# Patient Record
Sex: Female | Born: 1965 | Race: White | Hispanic: No | Marital: Married | Smoking: Never smoker
Health system: Southern US, Community
[De-identification: ages and names within clinical notes are randomized; demographics above are authoritative.]

## PROBLEM LIST (undated history)

## (undated) DIAGNOSIS — M654 Radial styloid tenosynovitis [de Quervain]: Secondary | ICD-10-CM

## (undated) DIAGNOSIS — E785 Hyperlipidemia, unspecified: Secondary | ICD-10-CM

## (undated) DIAGNOSIS — M419 Scoliosis, unspecified: Secondary | ICD-10-CM

## (undated) DIAGNOSIS — Z8489 Family history of other specified conditions: Secondary | ICD-10-CM

## (undated) DIAGNOSIS — I4729 Other ventricular tachycardia: Secondary | ICD-10-CM

## (undated) DIAGNOSIS — E039 Hypothyroidism, unspecified: Secondary | ICD-10-CM

## (undated) DIAGNOSIS — I472 Ventricular tachycardia, unspecified: Secondary | ICD-10-CM

## (undated) HISTORY — DX: Ventricular tachycardia, unspecified: I47.20

## (undated) HISTORY — PX: TRIGGER FINGER RELEASE: SHX641

## (undated) HISTORY — PX: BREAST EXCISIONAL BIOPSY: SUR124

## (undated) HISTORY — PX: COLONOSCOPY: SHX174

---

## 2004-11-10 ENCOUNTER — Ambulatory Visit: Payer: Self-pay | Admitting: Unknown Physician Specialty

## 2005-04-28 ENCOUNTER — Emergency Department: Payer: Self-pay | Admitting: Internal Medicine

## 2005-07-14 ENCOUNTER — Ambulatory Visit: Payer: Self-pay

## 2008-01-22 ENCOUNTER — Ambulatory Visit: Payer: Self-pay | Admitting: Unknown Physician Specialty

## 2009-02-18 ENCOUNTER — Ambulatory Visit: Payer: Self-pay | Admitting: Unknown Physician Specialty

## 2011-03-23 ENCOUNTER — Ambulatory Visit: Payer: Self-pay | Admitting: Unknown Physician Specialty

## 2012-04-10 ENCOUNTER — Ambulatory Visit: Payer: Self-pay | Admitting: Orthopedic Surgery

## 2017-05-17 ENCOUNTER — Other Ambulatory Visit: Payer: Self-pay | Admitting: Obstetrics & Gynecology

## 2017-05-17 DIAGNOSIS — Z1231 Encounter for screening mammogram for malignant neoplasm of breast: Secondary | ICD-10-CM

## 2017-06-01 ENCOUNTER — Ambulatory Visit
Admission: RE | Admit: 2017-06-01 | Discharge: 2017-06-01 | Disposition: A | Discharge status: Home / Self Care | Payer: 59 | Source: Ambulatory Visit | Attending: Obstetrics & Gynecology | Admitting: Obstetrics & Gynecology

## 2017-06-01 DIAGNOSIS — Z1231 Encounter for screening mammogram for malignant neoplasm of breast: Secondary | ICD-10-CM | POA: Insufficient documentation

## 2017-06-08 ENCOUNTER — Inpatient Hospital Stay
Admission: RE | Admit: 2017-06-08 | Discharge: 2017-06-08 | Disposition: A | Discharge status: Home / Self Care | Payer: Self-pay | Source: Ambulatory Visit | Attending: *Deleted | Admitting: *Deleted

## 2017-06-08 ENCOUNTER — Other Ambulatory Visit: Payer: Self-pay | Admitting: *Deleted

## 2017-06-08 DIAGNOSIS — Z9289 Personal history of other medical treatment: Secondary | ICD-10-CM

## 2018-06-13 ENCOUNTER — Other Ambulatory Visit: Payer: Self-pay | Admitting: Obstetrics & Gynecology

## 2018-06-13 DIAGNOSIS — Z1231 Encounter for screening mammogram for malignant neoplasm of breast: Secondary | ICD-10-CM

## 2018-06-27 ENCOUNTER — Ambulatory Visit
Admission: RE | Admit: 2018-06-27 | Discharge: 2018-06-27 | Disposition: A | Discharge status: Home / Self Care | Payer: Managed Care, Other (non HMO) | Source: Ambulatory Visit | Attending: Obstetrics & Gynecology | Admitting: Obstetrics & Gynecology

## 2018-06-27 DIAGNOSIS — Z1231 Encounter for screening mammogram for malignant neoplasm of breast: Secondary | ICD-10-CM | POA: Insufficient documentation

## 2019-07-25 ENCOUNTER — Other Ambulatory Visit: Payer: Self-pay | Admitting: Obstetrics & Gynecology

## 2019-07-25 DIAGNOSIS — Z1231 Encounter for screening mammogram for malignant neoplasm of breast: Secondary | ICD-10-CM

## 2019-12-02 ENCOUNTER — Ambulatory Visit
Admission: RE | Admit: 2019-12-02 | Discharge: 2019-12-02 | Disposition: A | Discharge status: Home / Self Care | Payer: Managed Care, Other (non HMO) | Source: Ambulatory Visit | Attending: Obstetrics & Gynecology | Admitting: Obstetrics & Gynecology

## 2019-12-02 DIAGNOSIS — Z1231 Encounter for screening mammogram for malignant neoplasm of breast: Secondary | ICD-10-CM | POA: Diagnosis present

## 2021-02-04 IMAGING — MG DIGITAL SCREENING BILAT W/ TOMO W/ CAD
6 of 10 series · 6 of 30 positions shown · non-contrast
Comparison: Previous exam(s).

CLINICAL DATA: Screening.

EXAM:
DIGITAL SCREENING BILATERAL MAMMOGRAM WITH TOMO AND CAD

[L MLO synth-2D]
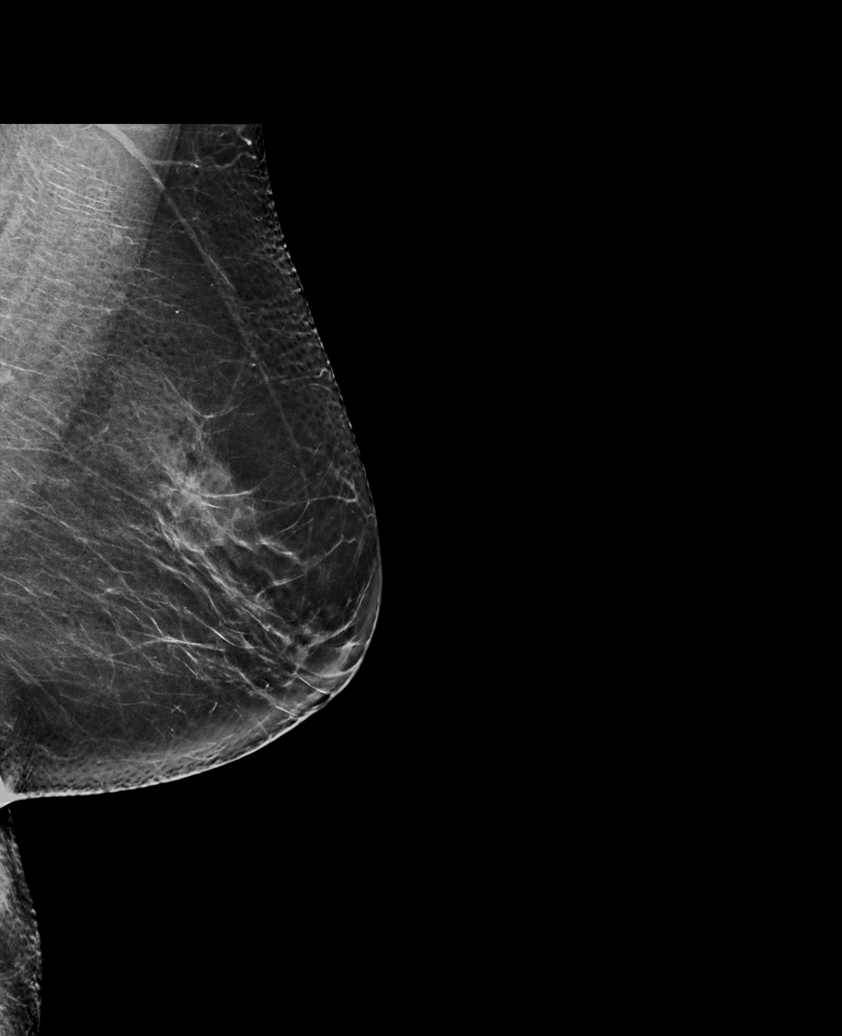

[L CC synth-2D]
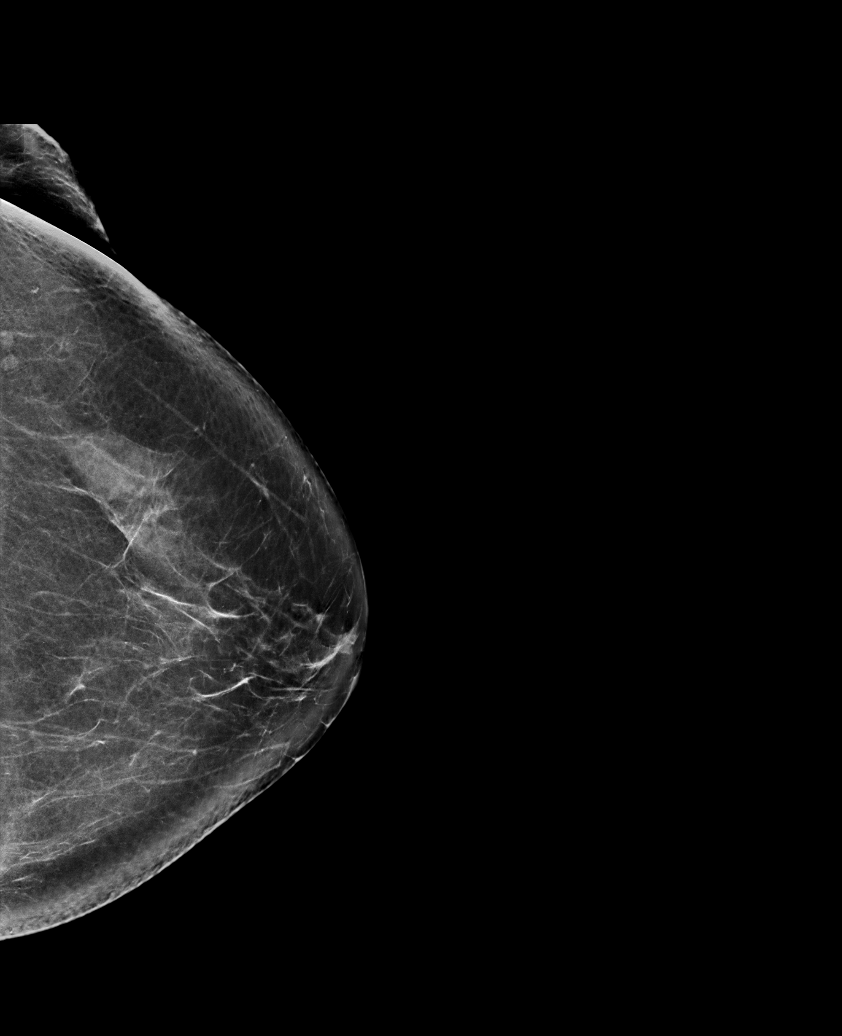

[R CC synth-2D (1 of 2)]
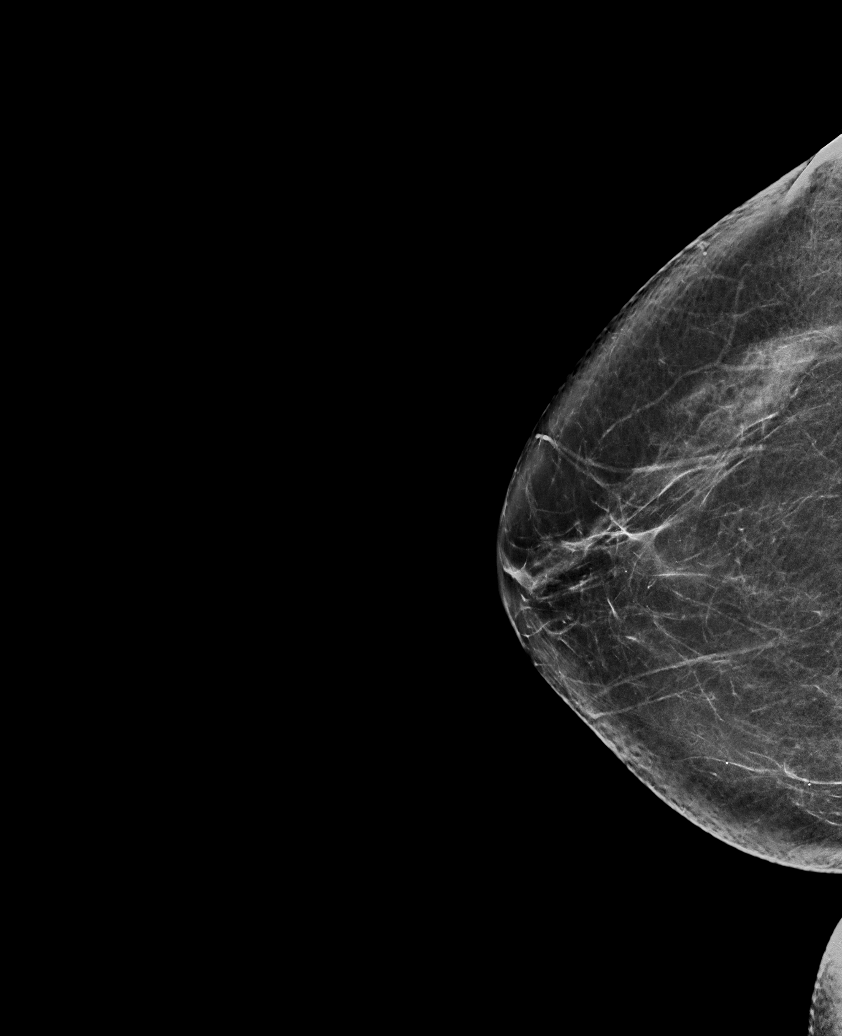

[R MLO synth-2D]
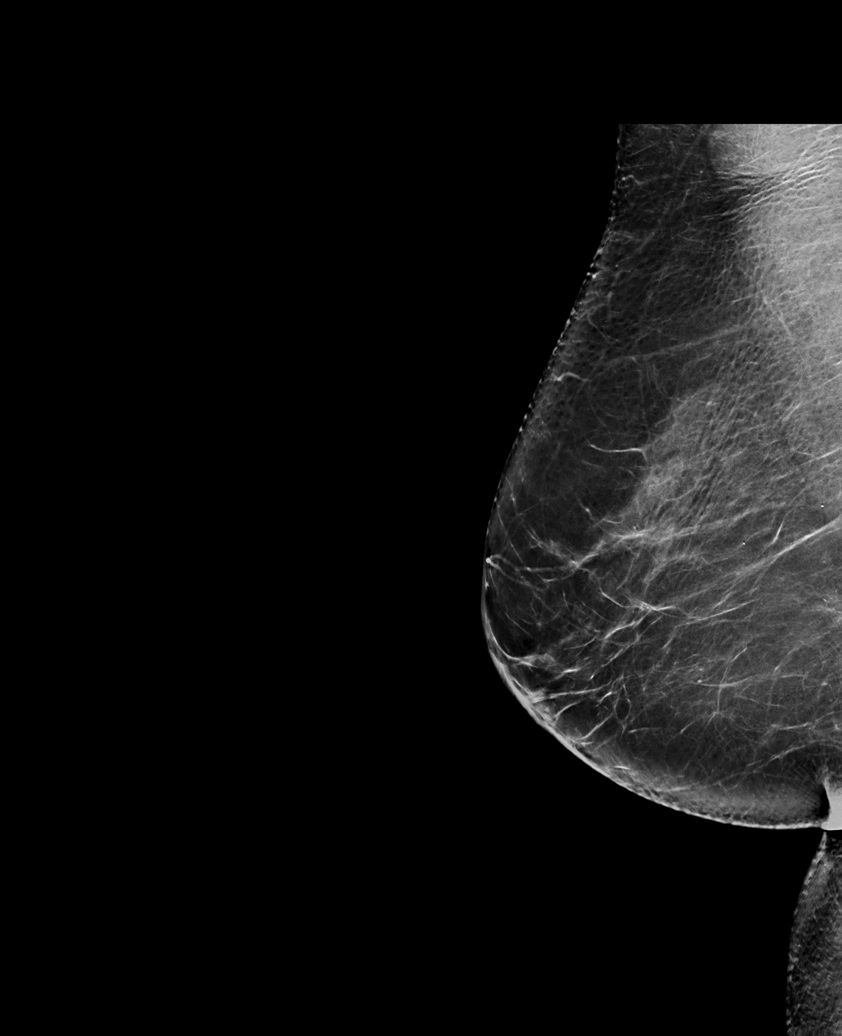

[R CC synth-2D (2 of 2)]
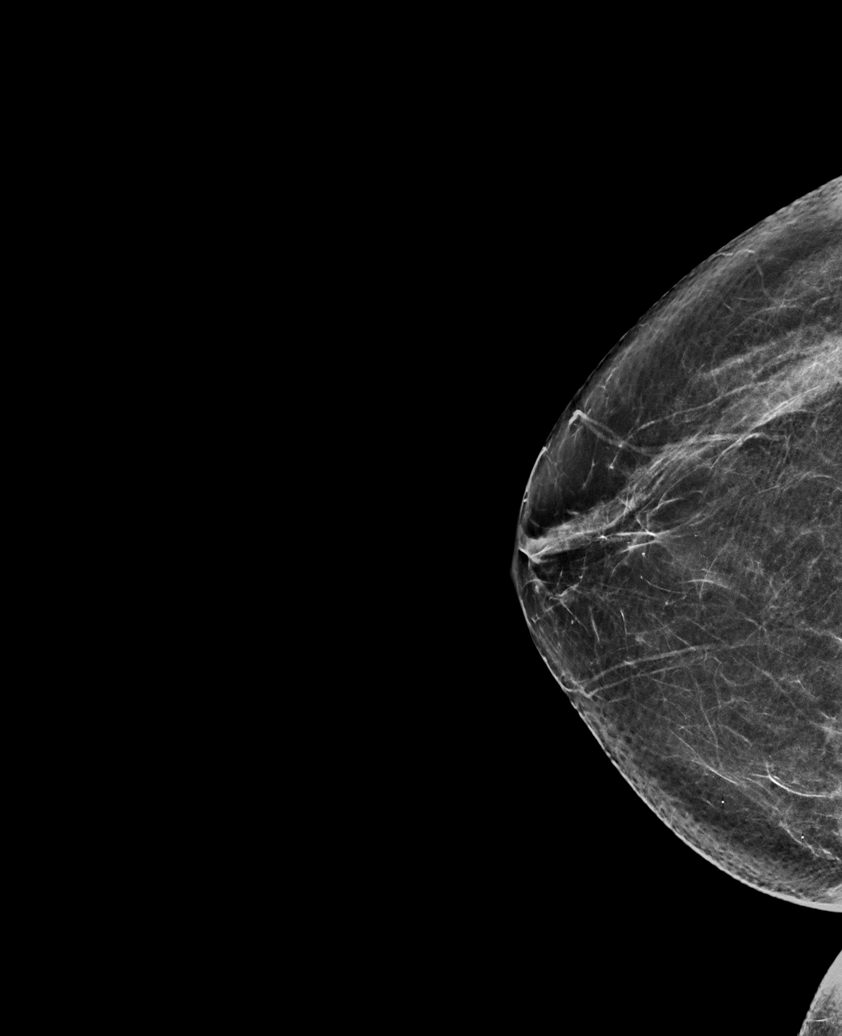

[L MLO tomo · tomo slice 39/76.0]
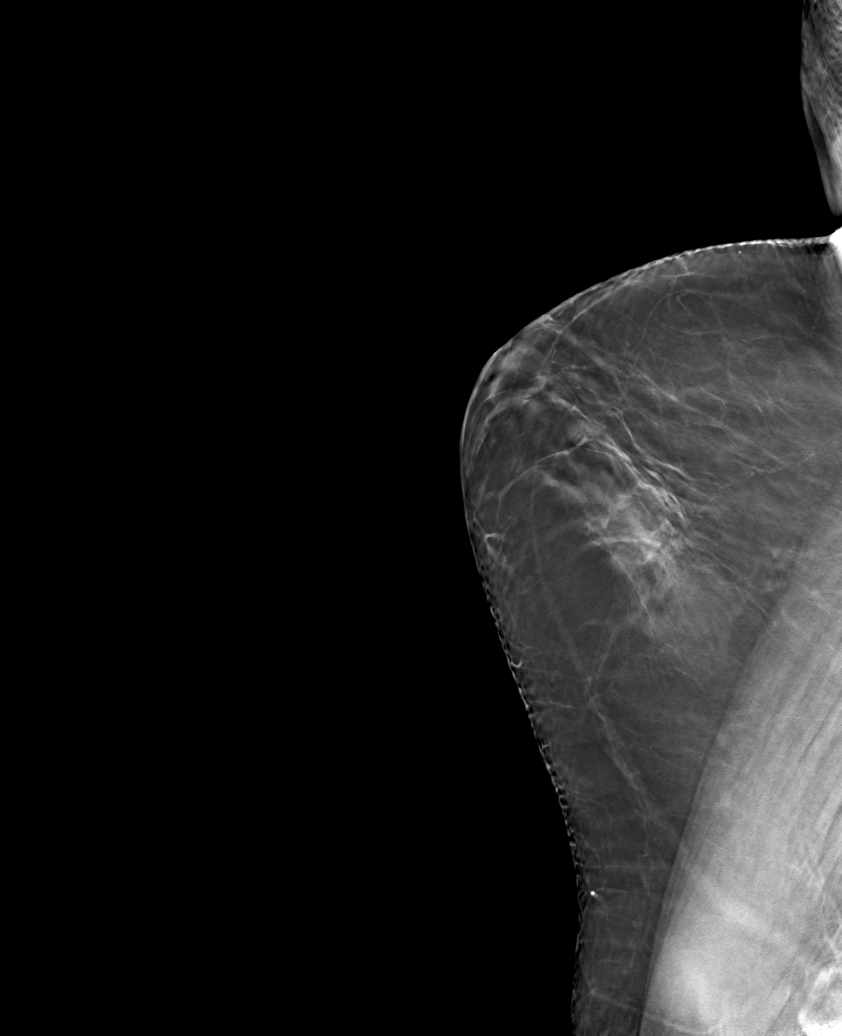

[6 of 30 positions shown; findings below may reference images not displayed]

ACR Breast Density Category b: There are scattered areas of
fibroglandular density.
FINDINGS: There are no findings suspicious for malignancy. Images were
processed with CAD.
IMPRESSION: No mammographic evidence of malignancy. A result letter of this
screening mammogram will be mailed directly to the patient.

RECOMMENDATION:
Screening mammogram in one year. (Code:[id number])

BI-RADS CATEGORY  1: Negative.

## 2022-06-01 ENCOUNTER — Other Ambulatory Visit: Payer: Self-pay | Admitting: Surgery

## 2022-06-07 ENCOUNTER — Encounter
Admission: RE | Admit: 2022-06-07 | Discharge: 2022-06-07 | Disposition: A | Discharge status: Home / Self Care | Payer: Managed Care, Other (non HMO) | Source: Ambulatory Visit | Attending: Surgery | Admitting: Surgery

## 2022-06-07 HISTORY — DX: Hypothyroidism, unspecified: E03.9

## 2022-06-07 HISTORY — DX: Scoliosis, unspecified: M41.9

## 2022-06-07 HISTORY — DX: Family history of other specified conditions: Z84.89

## 2022-06-07 NOTE — Patient Instructions (Addendum)
Your procedure is scheduled on:06-15-22 Wednesday Report to the Registration Desk on the 1st floor of the [facility].Then proceed to the 2nd floor Surgery Desk To find out your arrival time, please call [phone] between 1PM - 3PM on:06-14-22 Tuesday If your arrival time is 6:00 am, do not arrive prior to that time as the [facility] entrance doors do not open until 6:00 am.  REMEMBER: Instructions that are not followed completely may result in serious medical risk, up to and including death; or upon the discretion of your surgeon and anesthesiologist your surgery may need to be rescheduled.  Do not eat food after midnight the night before surgery.  No gum chewing, lozengers or hard candies.  You may however, drink CLEAR liquids up to 2 hours before you are scheduled to arrive for your surgery. Do not drink anything within 2 hours of your scheduled arrival time.  Clear liquids include: - water  - apple juice without pulp - gatorade (not RED colors) - black coffee or tea (Do NOT add milk or creamers to the coffee or tea) Do NOT drink anything that is not on this list.  In addition, your doctor has ordered for you to drink the provided  Ensure Pre-Surgery Clear Carbohydrate Drink  Drinking this carbohydrate drink up to two hours before surgery helps to reduce insulin resistance and improve patient outcomes. Please complete drinking 2 hours prior to scheduled arrival time.  TAKE THESE MEDICATIONS THE MORNING OF SURGERY WITH A SIP OF WATER: -flecainide (TAMBOCOR)  -levothyroxine (SYNTHROID)  -sertraline (ZOLOFT)  One week prior to surgery: Stop Anti-inflammatories (NSAIDS) such as meloxicam (MOBIC) , Advil, Aleve, Ibuprofen, Motrin, Naproxen, Naprosyn and Aspirin based products such as Excedrin, Goodys Powder, BC Powder.You may however,  take Tylenol if needed for pain up until the day of surgery.  Stop ANY OVER THE COUNTER supplements/vitamins NOW (06-07-22) until after surgery  (Calcium + D3, Multivitamin)  No Alcohol for 24 hours before or after surgery.  No Smoking including e-cigarettes for 24 hours prior to surgery.  No chewable tobacco products for at least 6 hours prior to surgery.  No nicotine patches on the day of surgery.  Do not use any "recreational" drugs for at least a week prior to your surgery.  Please be advised that the combination of cocaine and anesthesia may have negative outcomes, up to and including death. If you test positive for cocaine, your surgery will be cancelled.  On the morning of surgery brush your teeth with toothpaste and water, you may rinse your mouth with mouthwash if you wish. Do not swallow any toothpaste or mouthwash.  Use CHG Soap as directed on instruction sheet.  Do not wear jewelry, make-up, hairpins, clips or nail polish.  Do not wear lotions, powders, or perfumes.   Do not shave body from the neck down 48 hours prior to surgery just in case you cut yourself which could leave a site for infection.  Also, freshly shaved skin may become irritated if using the CHG soap.  Contact lenses, hearing aids and dentures may not be worn into surgery.  Do not bring valuables to the hospital. [facility] is not responsible for any missing/lost belongings or valuables.  Notify your doctor if there is any change in your medical condition (cold, fever, infection).  Wear comfortable clothing (specific to your surgery type) to the hospital.  After surgery, you can help prevent lung complications by doing breathing exercises.  Take deep breaths and cough every 1-2  hours. Your doctor may order a device called an Incentive Spirometer to help you take deep breaths. When coughing or sneezing, hold a pillow firmly against your incision with both hands. This is called "splinting." Doing this helps protect your incision. It also decreases belly discomfort.  If you are being admitted to the hospital overnight, leave your suitcase in the  car. After surgery it may be brought to your room.  If you are being discharged the day of surgery, you will not be allowed to drive home. You will need a responsible adult (18 years or older) to drive you home and stay with you that night.   If you are taking public transportation, you will need to have a responsible adult (18 years or older) with you. Please confirm with your physician that it is acceptable to use public transportation.   Please call the Pre-admissions Testing Dept. at [phone] if you have any questions about these instructions.  Surgery Visitation Policy:  Patients undergoing a surgery or procedure may have two family members or support persons with them as long as the person is not COVID-19 positive or experiencing its symptoms.

## 2022-06-10 ENCOUNTER — Inpatient Hospital Stay: Admission: RE | Admit: 2022-06-10 | Payer: Managed Care, Other (non HMO) | Source: Ambulatory Visit

## 2022-06-10 ENCOUNTER — Encounter: Payer: Self-pay | Admitting: Urgent Care

## 2022-06-10 ENCOUNTER — Encounter
Admission: RE | Admit: 2022-06-10 | Discharge: 2022-06-10 | Disposition: A | Discharge status: Home / Self Care | Payer: Managed Care, Other (non HMO) | Source: Ambulatory Visit | Attending: Surgery

## 2022-06-10 DIAGNOSIS — Z01810 Encounter for preprocedural cardiovascular examination: Secondary | ICD-10-CM | POA: Diagnosis not present

## 2022-06-10 DIAGNOSIS — I472 Ventricular tachycardia, unspecified: Secondary | ICD-10-CM

## 2022-06-10 DIAGNOSIS — Z01818 Encounter for other preprocedural examination: Secondary | ICD-10-CM

## 2022-06-13 ENCOUNTER — Encounter: Payer: Self-pay | Admitting: Surgery

## 2022-06-15 ENCOUNTER — Encounter: Payer: Self-pay | Admitting: Surgery

## 2022-06-15 ENCOUNTER — Encounter
Admission: RE | Disposition: A | Discharge status: Home / Self Care | Payer: Self-pay | Source: Home / Self Care | Attending: Surgery

## 2022-06-15 ENCOUNTER — Ambulatory Visit
Admission: RE | Admit: 2022-06-15 | Discharge: 2022-06-15 | Disposition: A | Discharge status: Home / Self Care | Payer: Managed Care, Other (non HMO) | Attending: Surgery | Admitting: Surgery

## 2022-06-15 ENCOUNTER — Ambulatory Visit: Payer: Managed Care, Other (non HMO) | Admitting: Urgent Care

## 2022-06-15 ENCOUNTER — Other Ambulatory Visit: Payer: Self-pay

## 2022-06-15 DIAGNOSIS — E785 Hyperlipidemia, unspecified: Secondary | ICD-10-CM | POA: Diagnosis not present

## 2022-06-15 DIAGNOSIS — Z79890 Hormone replacement therapy (postmenopausal): Secondary | ICD-10-CM | POA: Diagnosis not present

## 2022-06-15 DIAGNOSIS — Z01818 Encounter for other preprocedural examination: Secondary | ICD-10-CM

## 2022-06-15 DIAGNOSIS — Z79899 Other long term (current) drug therapy: Secondary | ICD-10-CM | POA: Insufficient documentation

## 2022-06-15 DIAGNOSIS — G5601 Carpal tunnel syndrome, right upper limb: Secondary | ICD-10-CM | POA: Insufficient documentation

## 2022-06-15 DIAGNOSIS — E039 Hypothyroidism, unspecified: Secondary | ICD-10-CM | POA: Diagnosis not present

## 2022-06-15 DIAGNOSIS — I472 Ventricular tachycardia, unspecified: Secondary | ICD-10-CM

## 2022-06-15 HISTORY — DX: Hyperlipidemia, unspecified: E78.5

## 2022-06-15 HISTORY — DX: Radial styloid tenosynovitis (de quervain): M65.4

## 2022-06-15 HISTORY — DX: Other ventricular tachycardia: I47.29

## 2022-06-15 HISTORY — PX: CARPAL TUNNEL RELEASE: SHX101

## 2022-06-15 SURGERY — RELEASE, CARPAL TUNNEL, ENDOSCOPIC
Anesthesia: General | Site: Wrist | Laterality: Right

## 2022-06-15 MED ORDER — SODIUM CHLORIDE 0.9 % IV SOLN: INTRAVENOUS | Status: DC

## 2022-06-15 MED ORDER — KETOROLAC TROMETHAMINE 30 MG/ML IJ SOLN
INTRAMUSCULAR | Status: AC
  Filled 2022-06-15: qty 1

## 2022-06-15 MED ORDER — CHLORHEXIDINE GLUCONATE 0.12 % MT SOLN
OROMUCOSAL | Status: AC
  Administered 2022-06-15: 15 mL via OROMUCOSAL
  Filled 2022-06-15: qty 15

## 2022-06-15 MED ORDER — MIDAZOLAM HCL 2 MG/2ML IJ SOLN
INTRAMUSCULAR | Status: DC | PRN
  Administered 2022-06-15: 2 mg via INTRAVENOUS

## 2022-06-15 MED ORDER — ONDANSETRON HCL 4 MG/2ML IJ SOLN
INTRAMUSCULAR | Status: DC | PRN
  Administered 2022-06-15: 4 mg via INTRAVENOUS

## 2022-06-15 MED ORDER — FAMOTIDINE 20 MG PO TABS: 20.0000 mg | ORAL_TABLET | Freq: Once | ORAL | Status: AC

## 2022-06-15 MED ORDER — LIDOCAINE HCL (PF) 2 % IJ SOLN
INTRAMUSCULAR | Status: AC
  Filled 2022-06-15: qty 5

## 2022-06-15 MED ORDER — LIDOCAINE HCL (CARDIAC) PF 100 MG/5ML IV SOSY
PREFILLED_SYRINGE | INTRAVENOUS | Status: DC | PRN
  Administered 2022-06-15: 100 mg via INTRAVENOUS

## 2022-06-15 MED ORDER — ONDANSETRON HCL 4 MG/2ML IJ SOLN
INTRAMUSCULAR | Status: AC
  Filled 2022-06-15: qty 2

## 2022-06-15 MED ORDER — FENTANYL CITRATE (PF) 100 MCG/2ML IJ SOLN
INTRAMUSCULAR | Status: DC | PRN
  Administered 2022-06-15 (×2): 50 ug via INTRAVENOUS

## 2022-06-15 MED ORDER — TRAMADOL HCL 50 MG PO TABS: 50.0000 mg | ORAL_TABLET | Freq: Four times a day (QID) | ORAL | 0 refills | Status: AC | PRN

## 2022-06-15 MED ORDER — BUPIVACAINE HCL (PF) 0.5 % IJ SOLN
INTRAMUSCULAR | Status: AC
  Filled 2022-06-15: qty 30

## 2022-06-15 MED ORDER — CEFAZOLIN SODIUM-DEXTROSE 2-4 GM/100ML-% IV SOLN
INTRAVENOUS | Status: AC
  Filled 2022-06-15: qty 100

## 2022-06-15 MED ORDER — 0.9 % SODIUM CHLORIDE (POUR BTL) OPTIME
TOPICAL | Status: DC | PRN
  Administered 2022-06-15: 500 mL

## 2022-06-15 MED ORDER — ORAL CARE MOUTH RINSE: 15.0000 mL | Freq: Once | OROMUCOSAL | Status: AC

## 2022-06-15 MED ORDER — FENTANYL CITRATE (PF) 100 MCG/2ML IJ SOLN
INTRAMUSCULAR | Status: AC
  Filled 2022-06-15: qty 2

## 2022-06-15 MED ORDER — METOCLOPRAMIDE HCL 10 MG PO TABS: 5.0000 mg | ORAL_TABLET | Freq: Three times a day (TID) | ORAL | Status: DC | PRN

## 2022-06-15 MED ORDER — TRAMADOL HCL 50 MG PO TABS: 50.0000 mg | ORAL_TABLET | Freq: Four times a day (QID) | ORAL | Status: DC | PRN

## 2022-06-15 MED ORDER — KETOROLAC TROMETHAMINE 30 MG/ML IJ SOLN
INTRAMUSCULAR | Status: DC | PRN
  Administered 2022-06-15: 15 mg via INTRAVENOUS

## 2022-06-15 MED ORDER — FAMOTIDINE 20 MG PO TABS
ORAL_TABLET | ORAL | Status: AC
  Administered 2022-06-15: 20 mg via ORAL
  Filled 2022-06-15: qty 1

## 2022-06-15 MED ORDER — DEXAMETHASONE SODIUM PHOSPHATE 10 MG/ML IJ SOLN
INTRAMUSCULAR | Status: AC
  Filled 2022-06-15: qty 1

## 2022-06-15 MED ORDER — DEXAMETHASONE SODIUM PHOSPHATE 10 MG/ML IJ SOLN
INTRAMUSCULAR | Status: DC | PRN
  Administered 2022-06-15: 10 mg via INTRAVENOUS

## 2022-06-15 MED ORDER — PROPOFOL 10 MG/ML IV BOLUS
INTRAVENOUS | Status: DC | PRN
  Administered 2022-06-15: 120 mg via INTRAVENOUS

## 2022-06-15 MED ORDER — LACTATED RINGERS IV SOLN: INTRAVENOUS | Status: DC

## 2022-06-15 MED ORDER — FENTANYL CITRATE (PF) 100 MCG/2ML IJ SOLN: 25.0000 ug | INTRAMUSCULAR | Status: DC | PRN

## 2022-06-15 MED ORDER — CEFAZOLIN SODIUM-DEXTROSE 2-4 GM/100ML-% IV SOLN
2.0000 g | INTRAVENOUS | Status: AC
  Administered 2022-06-15: 2 g via INTRAVENOUS

## 2022-06-15 MED ORDER — METOCLOPRAMIDE HCL 5 MG/ML IJ SOLN: 5.0000 mg | Freq: Three times a day (TID) | INTRAMUSCULAR | Status: DC | PRN

## 2022-06-15 MED ORDER — PROPOFOL 10 MG/ML IV BOLUS
INTRAVENOUS | Status: AC
  Filled 2022-06-15: qty 20

## 2022-06-15 MED ORDER — BUPIVACAINE HCL (PF) 0.5 % IJ SOLN
INTRAMUSCULAR | Status: DC | PRN
  Administered 2022-06-15: 10 mL

## 2022-06-15 MED ORDER — OXYCODONE HCL 5 MG PO TABS: 5.0000 mg | ORAL_TABLET | Freq: Once | ORAL | Status: DC | PRN

## 2022-06-15 MED ORDER — MIDAZOLAM HCL 2 MG/2ML IJ SOLN
INTRAMUSCULAR | Status: AC
  Filled 2022-06-15: qty 2

## 2022-06-15 MED ORDER — ONDANSETRON HCL 4 MG PO TABS: 4.0000 mg | ORAL_TABLET | Freq: Four times a day (QID) | ORAL | Status: DC | PRN

## 2022-06-15 MED ORDER — ONDANSETRON HCL 4 MG/2ML IJ SOLN: 4.0000 mg | Freq: Four times a day (QID) | INTRAMUSCULAR | Status: DC | PRN

## 2022-06-15 MED ORDER — CHLORHEXIDINE GLUCONATE 0.12 % MT SOLN: 15.0000 mL | Freq: Once | OROMUCOSAL | Status: AC

## 2022-06-15 MED ORDER — OXYCODONE HCL 5 MG/5ML PO SOLN: 5.0000 mg | Freq: Once | ORAL | Status: DC | PRN

## 2022-06-15 SURGICAL SUPPLY — 34 items
APL PRP STRL LF DISP 70% ISPRP (MISCELLANEOUS) ×1
BNDG CMPR 5X4 CHSV STRCH STRL (GAUZE/BANDAGES/DRESSINGS) ×1
BNDG COHESIVE 4X5 TAN STRL LF (GAUZE/BANDAGES/DRESSINGS) ×1 IMPLANT
BNDG ELASTIC 2X5.8 VLCR STR LF (GAUZE/BANDAGES/DRESSINGS) ×1 IMPLANT
BNDG ESMARK 4X12 TAN STRL LF (GAUZE/BANDAGES/DRESSINGS) ×1 IMPLANT
CHLORAPREP W/TINT 26 (MISCELLANEOUS) ×1 IMPLANT
CORD BIP STRL DISP 12FT (MISCELLANEOUS) ×1 IMPLANT
CUFF TOURN SGL QUICK 18X4 (TOURNIQUET CUFF) ×1 IMPLANT
DRAPE SURG 17X11 SM STRL (DRAPES) ×1 IMPLANT
FORCEPS JEWEL BIP 4-3/4 STR (INSTRUMENTS) ×1 IMPLANT
GAUZE SPONGE 4X4 12PLY STRL (GAUZE/BANDAGES/DRESSINGS) ×1 IMPLANT
GAUZE XEROFORM 1X8 LF (GAUZE/BANDAGES/DRESSINGS) ×1 IMPLANT
GLOVE BIO SURGEON STRL SZ8 (GLOVE) ×1 IMPLANT
GLOVE SURG UNDER LTX SZ8 (GLOVE) ×1 IMPLANT
GOWN STRL REUS W/ TWL LRG LVL3 (GOWN DISPOSABLE) ×1 IMPLANT
GOWN STRL REUS W/ TWL XL LVL3 (GOWN DISPOSABLE) ×1 IMPLANT
GOWN STRL REUS W/TWL LRG LVL3 (GOWN DISPOSABLE) ×1
GOWN STRL REUS W/TWL XL LVL3 (GOWN DISPOSABLE) ×1
KIT CARPAL TUNNEL (MISCELLANEOUS) ×1
KIT ESCP INSRT D SLOT CANN KN (MISCELLANEOUS) ×1 IMPLANT
KIT TURNOVER KIT A (KITS) ×1 IMPLANT
MANIFOLD NEPTUNE II (INSTRUMENTS) ×1 IMPLANT
NS IRRIG 500ML POUR BTL (IV SOLUTION) ×1 IMPLANT
PACK EXTREMITY ARMC (MISCELLANEOUS) ×1 IMPLANT
SPLINT WRIST LG LT TX990309 (SOFTGOODS) IMPLANT
SPLINT WRIST LG RT TX900304 (SOFTGOODS) IMPLANT
SPLINT WRIST M LT TX990308 (SOFTGOODS) IMPLANT
SPLINT WRIST M RT TX990303 (SOFTGOODS) IMPLANT
SPLINT WRIST XL LT TX990310 (SOFTGOODS) IMPLANT
SPLINT WRIST XL RT TX990305 (SOFTGOODS) IMPLANT
STOCKINETTE IMPERVIOUS 9X36 MD (GAUZE/BANDAGES/DRESSINGS) ×1 IMPLANT
SUT PROLENE 4 0 PS 2 18 (SUTURE) ×1 IMPLANT
TRAP FLUID SMOKE EVACUATOR (MISCELLANEOUS) ×1 IMPLANT
WATER STERILE IRR 500ML POUR (IV SOLUTION) ×1 IMPLANT

## 2022-06-15 NOTE — Anesthesia Preprocedure Evaluation (Signed)
Anesthesia Evaluation  Patient identified by MRN, date of birth, ID band Patient awake    Reviewed: Allergy & Precautions, NPO status , Patient's Chart, lab work & pertinent test results  History of Anesthesia Complications Negative for: history of anesthetic complications  Airway Mallampati: III  TM Distance: >3 FB Neck ROM: full    Dental  (+) Chipped   Pulmonary neg pulmonary ROS, neg shortness of breath,    Pulmonary exam normal        Cardiovascular Exercise Tolerance: Good + dysrhythmias Supra Ventricular Tachycardia      Neuro/Psych negative neurological ROS  negative psych ROS   GI/Hepatic negative GI ROS, Neg liver ROS, neg GERD  ,  Endo/Other  Hypothyroidism   Renal/GU      Musculoskeletal   Abdominal   Peds  Hematology negative hematology ROS (+)   Anesthesia Other Findings Past Medical History: No date: De Quervain's disease (tenosynovitis) No date: Family history of adverse reaction to anesthesia     Comment:  sister has a hard time waking up No date: HLD (hyperlipidemia) No date: Hypothyroidism No date: RVOT ventricular tachycardia (HCC)     Comment:  a.) Tx'd with daily flecanide No date: Scoliosis  Past Surgical History: No date: BREAST EXCISIONAL BIOPSY; Right     Comment:  benign No date: CESAREAN SECTION     Comment:  x2 No date: COLONOSCOPY No date: TRIGGER FINGER RELEASE; Right  BMI    Body Mass Index: 27.22 kg/m      Reproductive/Obstetrics negative OB ROS                             Anesthesia Physical Anesthesia Plan  ASA: 3  Anesthesia Plan: General LMA   Post-op Pain Management:    Induction: Intravenous  PONV Risk Score and Plan: Dexamethasone, Ondansetron, Midazolam and Treatment may vary due to age or medical condition  Airway Management Planned: LMA  Additional Equipment:   Intra-op Plan:   Post-operative Plan: Extubation in  OR  Informed Consent: I have reviewed the patients History and Physical, chart, labs and discussed the procedure including the risks, benefits and alternatives for the proposed anesthesia with the patient or authorized representative who has indicated his/her understanding and acceptance.     Dental Advisory Given  Plan Discussed with: Anesthesiologist, CRNA and Surgeon  Anesthesia Plan Comments: (Patient consented for risks of anesthesia including but not limited to:  - adverse reactions to medications - damage to eyes, teeth, lips or other oral mucosa - nerve damage due to positioning  - sore throat or hoarseness - Damage to heart, brain, nerves, lungs, other parts of body or loss of life  Patient voiced understanding.)        Anesthesia Quick Evaluation

## 2022-06-15 NOTE — Transfer of Care (Signed)
Immediate Anesthesia Transfer of Care Note  Patient: Tina Edwards  Procedure(s) Performed: CARPAL TUNNEL RELEASE ENDOSCOPIC (Right: Wrist)  Patient Location: PACU  Anesthesia Type:General  Level of Consciousness: drowsy  Airway & Oxygen Therapy: Patient Spontanous Breathing and Patient connected to face mask oxygen  Post-op Assessment: Report given to RN and Post -op Vital signs reviewed and stable  Post vital signs: Reviewed and stable  Last Vitals:  Vitals Value Taken Time  BP 124/74 06/15/22 1003  Temp 35.8 1002  Pulse 50 06/15/22 1004  Resp 15 06/15/22 1004  SpO2 100 % 06/15/22 1004  Vitals shown include unvalidated device data.  Last Pain:  Vitals:   06/15/22 0740  TempSrc: Temporal  PainSc: 0-No pain         Complications: No notable events documented.

## 2022-06-15 NOTE — Discharge Instructions (Addendum)
Orthopedic discharge instructions: Keep dressing dry and intact. Keep hand elevated above heart level. May shower after dressing removed on postop day 4 (Sunday). Cover sutures with Band-Aids after drying off, then reapply Velcro splint. Apply ice to affected area frequently. Resume meloxicam 15 mg daily OR take ibuprofen 600-800 mg TID with meals for 3-5 days, then as necessary. Take ES Tylenol or pain medication as prescribed when needed.  Return for follow-up in 10-14 days or as scheduled     .AMBULATORY SURGERY  DISCHARGE INSTRUCTIONS   The drugs that you were given will stay in your system until tomorrow so for the next 24 hours you should not:  Drive an automobile Make any legal decisions Drink any alcoholic beverage   You may resume regular meals tomorrow.  Today it is better to start with liquids and gradually work up to solid foods.  You may eat anything you prefer, but it is better to start with liquids, then soup and crackers, and gradually work up to solid foods.   Please notify your doctor immediately if you have any unusual bleeding, trouble breathing, redness and pain at the surgery site, drainage, fever, or pain not relieved by medication.     Your post-operative visit with Dr.                                       is: Date:                        Time:    Please call to schedule your post-operative visit.  Additional Instructions:

## 2022-06-15 NOTE — Anesthesia Procedure Notes (Signed)
Procedure Name: LMA Insertion Date/Time: 06/15/2022 9:32 AM  Performed by: Cammie Sickle, CRNAPre-anesthesia Checklist: Patient identified, Patient being monitored, Timeout performed, Emergency Drugs available and Suction available Patient Re-evaluated:Patient Re-evaluated prior to induction Oxygen Delivery Method: Circle system utilized Preoxygenation: Pre-oxygenation with 100% oxygen Induction Type: IV induction Ventilation: Mask ventilation without difficulty LMA: LMA inserted LMA Size: 3.0 Tube type: Oral Number of attempts: 1 Placement Confirmation: positive ETCO2 and breath sounds checked- equal and bilateral Tube secured with: Tape Dental Injury: Teeth and Oropharynx as per pre-operative assessment

## 2022-06-15 NOTE — H&P (Signed)
History of Present Illness:  Tina Edwards is a 56 y.o. female who presents for evaluation and treatment of a several year history of pain and paresthesias to her right hand. Her symptoms are worse at night, frequently awakening her from sleep multiple times per night. She notes that the fingertips involved include the thumb, index, and long fingers as well as part of the ring finger. She also has difficulty during the day with repetitive activities such as handwriting, but does not have any difficulty using a keyboard. She is right-hand dominant. She has taken over-the-counter medications, as well as Mobic with limited benefit. She is wearing a splint at night which provides some relief in terms of the pain to her wrist and hand, but has not had any relief of the numbness to her fingers. She has undergone an EMG which confirms the presence of carpal tunnel syndrome. She has been referred to me to discuss further treatment options.  Current Outpatient Medications: calcium carbonate-vitamin D3 (OS-CAL 500+D) 500 mg(1,250mg ) -200 unit tablet Take 1 tablet by mouth 2 (two) times daily with meals.  doxylamine succinate (SLEEP AID, DOXYLAMINE, ORAL) Take by mouth nightly as needed.  flecainide (TAMBOCOR) 100 MG tablet Take 1 tablet (100 mg total) by mouth 2 (two) times daily 180 tablet 4  JINTELI 1-5 mg-mcg tablet Take 1 tablet by mouth once daily 30 tablet 0  levothyroxine (SYNTHROID) 50 MCG tablet Take 1 tablet (50 mcg total) by mouth once daily Take on an empty stomach with a glass of water at least 30-60 minutes before breakfast. 30 tablet 5  meloxicam (MOBIC) 15 MG tablet TAKE 1 TABLET (15 MG TOTAL) BY MOUTH ONCE DAILY FOR 30 DAYS 30 tablet 1  multivitamin capsule Take 1 capsule by mouth once daily.  naproxen sodium (ALEVE, ANAPROX) 220 MG tablet Take 220 mg by mouth once daily as needed.   sertraline (ZOLOFT) 50 MG tablet Take 1 tablet (50 mg total) by mouth once daily 30 tablet 0    Allergies: No  Known Allergies  Past Medical History:  Allergic rhinitis  Cervicalgia  Chickenpox  Scoliosis  Status post normal childbirth 1992, 2001   Past Surgical History:  CESAREAN SECTION 12/1990  CESAREAN SECTION 01/2000  COLONOSCOPY 05/30/2017  Int Hemorrhoids: CBF 05/2027  Release of right trigger thumb 10/20/2021 (Dr. Rudene Christians)  BREAST BIOPSY Not sure  Facial surgery to repair bone   Family History:  Hepatitis B Father 34  Lung cancer Mother  Liver cancer Mother  Heart disease Mother  blockage and a couple stints  Heart disease Maternal Grandmother   Social History:   Socioeconomic History:  Marital status: Married  Spouse name: Ken  Occupational History  Occupation: Therapist, art Rep  Tobacco Use  Smoking status: Never  Smokeless tobacco: Never  Vaping Use  Vaping Use: Never used  Substance and Sexual Activity  Alcohol use: Yes  Alcohol/week: 5.0 standard drinks  Types: 5 Cans of beer per week  Drug use: No  Sexual activity: Yes  Partners: Male  Birth control/protection: Post-menopausal  Other Topics Concern  Would you please tell us about the people who live in your home, your pets, or anything else important to your social life? No   Review of Systems:  A comprehensive 14 point ROS was performed, reviewed, and the pertinent orthopaedic findings are documented in the HPI.  Physical Exam: Vitals:  05/27/22 1330  BP: (!) 132/92  Weight: 76.5 kg (168 lb 9.6 oz)  Height: 167.6 cm (5\' 6" )  PainSc: 1  PainLoc: Wrist   General/Constitutional: The patient appears to be well-nourished, well-developed, and in no acute distress. Neuro/Psych: Normal mood and affect, oriented to person, place and time. Eyes: Non-icteric. Pupils are equal, round, and reactive to light, and exhibit synchronous movement. ENT: Unremarkable. Lymphatic: No palpable adenopathy. Respiratory: Lungs clear to auscultation, Normal chest excursion, No wheezes, and Non-labored  breathing Cardiovascular: Regular rate and rhythm. No murmurs. and No edema, swelling or tenderness, except as noted in detailed exam. Integumentary: No impressive skin lesions present, except as noted in detailed exam. Musculoskeletal: Unremarkable, except as noted in detailed exam.  Right wrist/hand exam: Skin inspection of the right wrist and hand is unremarkable. No swelling, erythema, ecchymosis, abrasions, or other skin abnormalities are identified. She has no tenderness to palpation over the dorsal or palmar aspects of the wrist, nor she have any tenderness to palpation over the dorsal or volar aspects of the hand. She exhibits full active and passive range of motion of the wrist without any discomfort. She is able to actively flex and extend all digits fully without any pain or triggering. She is able to oppose her thumb to the base of the little finger. She is neurovascular intact to all digits. She exhibits a positive Phalen's test as well as a mildly positive Tinel's sign over the right carpal tunnel.  EMG results:  The results of her recent EMG are available for review and have been reviewed by myself. By report, the study demonstrates evidence of "mild" carpal tunnel syndrome. This report has been reviewed by myself and discussed with the patient.  Assessment: Carpal tunnel syndrome, right.   Plan: The treatment options were discussed with the patient. In addition, patient educational materials were provided regarding the diagnosis and treatment options. The patient is quite frustrated by her symptoms and functional limitations, and is ready to consider more aggressive treatment options. Therefore, I have recommended a surgical procedure, specifically an endoscopic right carpal tunnel release. The procedure was discussed with the patient, as were the potential risks (including bleeding, infection, nerve and/or blood vessel injury, persistent or recurrent pain/paresthesias, weakness of  grip, need for further surgery, blood clots, strokes, heart attacks and/or arhythmias, pneumonia, etc.) and benefits. The patient states her understanding and wishes to proceed. All of the patient's questions and concerns were answered. She can call any time with further concerns. She will follow up post-surgery, routine.    H&P reviewed and patient re-examined. No changes.

## 2022-06-15 NOTE — Anesthesia Postprocedure Evaluation (Signed)
Anesthesia Post Note  Patient: Tina Edwards  Procedure(s) Performed: CARPAL TUNNEL RELEASE ENDOSCOPIC (Right: Wrist)  Patient location during evaluation: PACU Anesthesia Type: General Level of consciousness: awake and alert Pain management: pain level controlled Vital Signs Assessment: post-procedure vital signs reviewed and stable Respiratory status: spontaneous breathing, nonlabored ventilation, respiratory function stable and patient connected to nasal cannula oxygen Cardiovascular status: blood pressure returned to baseline and stable Postop Assessment: no apparent nausea or vomiting Anesthetic complications: no   No notable events documented.   Last Vitals:  Vitals:   06/15/22 1030 06/15/22 1104  BP: 131/70 120/75  Pulse: (!) 58 (!) 58  Resp: 13 16  Temp: (!) 36.1 C 36.4 C  SpO2: 100%     Last Pain:  Vitals:   06/15/22 1104  TempSrc: Axillary  PainSc: 0-No pain                 Precious Haws Bitha Fauteux

## 2022-06-15 NOTE — Op Note (Signed)
06/15/2022  10:03 AM  Patient:   Tina Edwards  Pre-Op Diagnosis:   Right carpal tunnel syndrome.  Post-Op Diagnosis:   Same.  Procedure:   Endoscopic right carpal tunnel release.  Surgeon:   Pascal Lux, MD  Anesthesia:   General LMA  Findings:   As above.  Complications:   None  EBL:   0 cc  Fluids:   350 cc crystalloid  TT:   11 minutes at 250 mmHg  Drains:   None  Closure:   4-0 Prolene interrupted sutures  Brief Clinical Note:   The patient is a 56 year old female with a history of progressively worsening pain and paresthesias to her right hand. Her symptoms have progressed despite medications, activity modification, etc. Her history and examination are consistent with carpal tunnel syndrome, confirmed by EMG. The patient presents at this time for an endoscopic right carpal tunnel release.   Procedure:   The patient was brought into the operating room and lain in the supine position. After adequate general laryngeal mask anesthesia was obtained, the right hand and upper extremity were prepped with ChloraPrep solution before being draped sterilely. Preoperative antibiotics were administered. A timeout was performed to verify the appropriate surgical site before the limb was exsanguinated with an Esmarch and the tourniquet inflated to 250 mmHg.   An approximately 1.5-2 cm incision was made over the volar wrist flexion crease, centered over the palmaris longus tendon. The incision was carried down through the subcutaneous tissues with care taken to identify and protect any neurovascular structures. The distal forearm fascia was penetrated just proximal to the transverse carpal ligament. The soft tissues were released off the superficial and deep surfaces of the distal forearm fascia and this was released proximally for 3-4 cm under direct visualization.  Attention was directed distally. The Soil scientist was passed beneath the transverse carpal ligament along the ulnar  aspect of the carpal tunnel and used to release any adhesions as well as to remove any adherent synovial tissue before first the smaller then the larger of the two dilators were passed beneath the transverse carpal ligament along the ulnar margin of the carpal tunnel. The slotted cannula was introduced and the endoscope was placed into the slotted cannula and the undersurface of the transverse carpal ligament visualized. The distal margin of the transverse carpal ligament was marked by placing a 25-gauge needle percutaneously at Henry cardinal point so that it entered the distal portion of the slotted cannula. Under endoscopic visualization, the transverse carpal ligament was released from proximal to distal using the end-cutting blade. A second pass was performed to ensure complete release of the ligament. The adequacy of release was verified both endoscopically and by palpation using the freer elevator.  The wound was irrigated thoroughly with sterile saline solution before being closed using 4-0 Prolene interrupted sutures. A total of 10 cc of 0.5% plain Sensorcaine was injected in and around the incision before a sterile bulky dressing was applied to the wound. The patient was placed into a volar wrist splint before being awakened, extubated, and returned to the recovery room in satisfactory condition after tolerating the procedure well.

## 2022-06-16 ENCOUNTER — Encounter: Payer: Self-pay | Admitting: Surgery

## 2023-09-25 ENCOUNTER — Other Ambulatory Visit: Payer: Self-pay | Admitting: Certified Nurse Midwife

## 2023-09-25 DIAGNOSIS — Z1231 Encounter for screening mammogram for malignant neoplasm of breast: Secondary | ICD-10-CM

## 2023-09-29 ENCOUNTER — Ambulatory Visit
Admission: RE | Admit: 2023-09-29 | Discharge: 2023-09-29 | Disposition: A | Discharge status: Home / Self Care | Payer: Managed Care, Other (non HMO) | Source: Ambulatory Visit | Attending: Certified Nurse Midwife | Admitting: Certified Nurse Midwife

## 2023-09-29 DIAGNOSIS — Z1231 Encounter for screening mammogram for malignant neoplasm of breast: Secondary | ICD-10-CM | POA: Diagnosis present
